# Patient Record
Sex: Male | Born: 2000 | Race: White | Hispanic: No | Marital: Single
Health system: Southern US, Community
[De-identification: ages and names within clinical notes are randomized; demographics above are authoritative.]

---
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
## 2020-03-29 ENCOUNTER — Ambulatory Visit: Payer: Self-pay | Attending: Internal Medicine

## 2020-03-29 DIAGNOSIS — Z23 Encounter for immunization: Secondary | ICD-10-CM

## 2020-03-29 NOTE — Progress Notes (Signed)
   Covid-[facility] Vaccination [facility]  Name:  LUCCIANO VITALI    MRN: [id number] DOB: [birth year]  03/29/2020  Mr. Enis was observed post Covid-19 immunization for 15 minutes without incident. He was provided with Vaccine Information Sheet and instruction to access the V-Safe system.   Mr. Lall was instructed to call 911 with any severe reactions post vaccine: Marland Kitchen Difficulty breathing  . Swelling of face and throat  . A fast heartbeat  . A bad rash all over body  . Dizziness and weakness   Immunizations Administered    Name Date Dose VIS Date Route   Pfizer COVID-19 Vaccine 03/29/2020 12:00 PM 0.3 mL 02/01/2019 Intramuscular   Manufacturer: [employer], [employer]   Lot: [id number]   NDC: [address]-9

## 2020-04-20 ENCOUNTER — Ambulatory Visit: Payer: Self-pay | Attending: Internal Medicine

## 2020-04-20 DIAGNOSIS — Z23 Encounter for immunization: Secondary | ICD-10-CM

## 2020-04-20 NOTE — Progress Notes (Signed)
   Covid-19 Vaccination Clinic  Name:  Gordon Boone    MRN: [id number] DOB: 2001/11/21  04/20/2020  [name] was observed post Covid-19 immunization for 15 minutes without incident. He was provided with Vaccine Information Sheet and instruction to access the V-Safe system.   [name] was instructed to call 911 with any severe reactions post vaccine: Marland Kitchen Difficulty breathing  . Swelling of face and throat  . A fast heartbeat  . A bad rash all over body  . Dizziness and weakness   Immunizations Administered    Name Date Dose VIS Date Route   Pfizer COVID-19 Vaccine 04/20/2020 11:53 AM 0.3 mL 02/01/2019 Intramuscular   Manufacturer: [employer], [employer]   Lot: [id number]   NDC: [address]-1

## 2020-04-25 ENCOUNTER — Ambulatory Visit: Payer: Self-pay
